# Patient Record
Sex: Male | Born: 1955 | Race: White | Hispanic: No | Smoking: Former smoker
Health system: Southern US, Community
[De-identification: ages and names within clinical notes are randomized; demographics above are authoritative.]

## PROBLEM LIST (undated history)

## (undated) DIAGNOSIS — I499 Cardiac arrhythmia, unspecified: Secondary | ICD-10-CM

---

## 2001-07-16 ENCOUNTER — Ambulatory Visit (HOSPITAL_BASED_OUTPATIENT_CLINIC_OR_DEPARTMENT_OTHER): Admission: RE | Admit: 2001-07-16 | Discharge: 2001-07-16 | Payer: Self-pay | Admitting: Ophthalmology

## 2003-10-01 ENCOUNTER — Ambulatory Visit (HOSPITAL_BASED_OUTPATIENT_CLINIC_OR_DEPARTMENT_OTHER): Admission: RE | Admit: 2003-10-01 | Discharge: 2003-10-01 | Payer: Self-pay | Admitting: Ophthalmology

## 2005-06-24 ENCOUNTER — Emergency Department: Payer: Self-pay | Admitting: Internal Medicine

## 2005-10-19 ENCOUNTER — Ambulatory Visit (HOSPITAL_BASED_OUTPATIENT_CLINIC_OR_DEPARTMENT_OTHER): Admission: RE | Admit: 2005-10-19 | Discharge: 2005-10-19 | Payer: Self-pay | Admitting: Ophthalmology

## 2006-02-02 ENCOUNTER — Ambulatory Visit (HOSPITAL_BASED_OUTPATIENT_CLINIC_OR_DEPARTMENT_OTHER): Admission: RE | Admit: 2006-02-02 | Discharge: 2006-02-02 | Payer: Self-pay | Admitting: Ophthalmology

## 2006-12-17 ENCOUNTER — Ambulatory Visit: Payer: Self-pay | Admitting: Orthopedic Surgery

## 2008-03-25 IMAGING — CR ORBITS FOR FOREIGN BODY - 2 VIEW
1 series · 3 of 3 positions shown · non-contrast
Comparison: none

REASON FOR EXAM: metal clearance for mri
COMMENTS:

PROCEDURE:     DXR - DXR ORBITS FOR MRI CLEARANCE  - December 17, 2006  [DATE]
RESULT:     Views of the orbits reveal no evidence of retained metallic
foreign bodies. I see no contraindication to MRI.

[Series 1: view not recorded · 0.17mm/px · 3 of 3 slices shown]
[im 1/3]
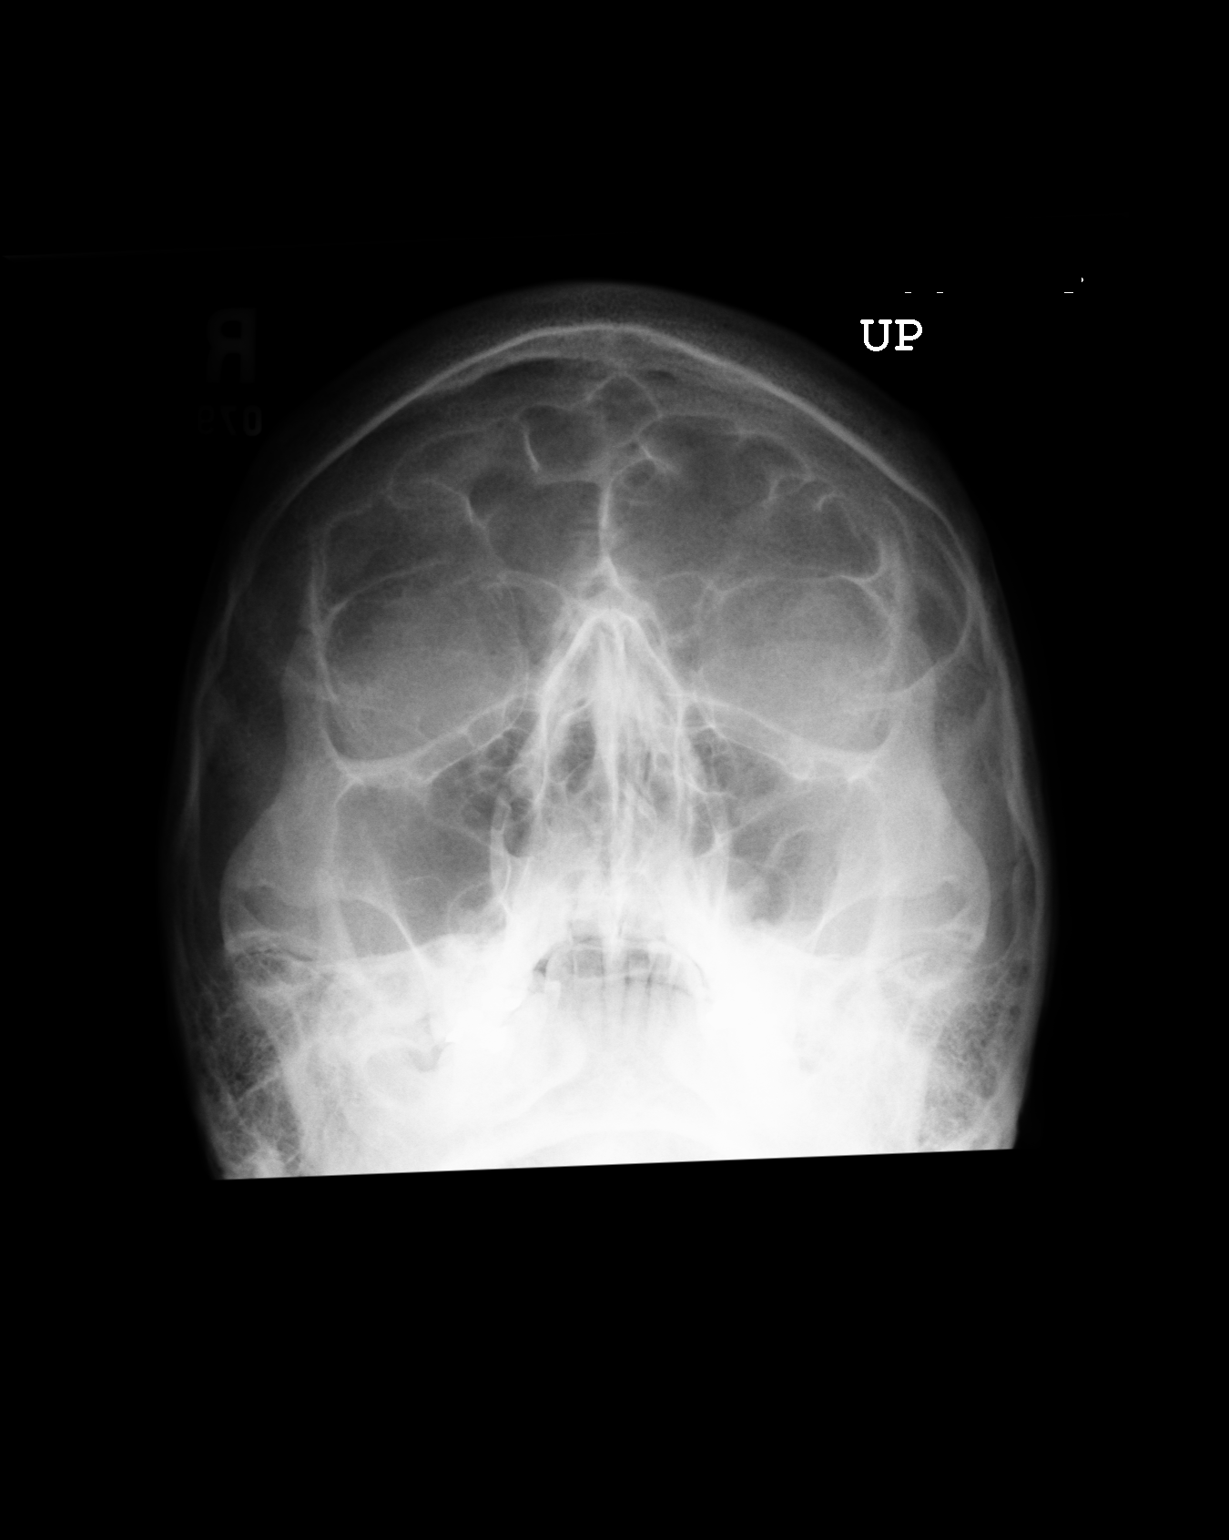
[im 2/3]
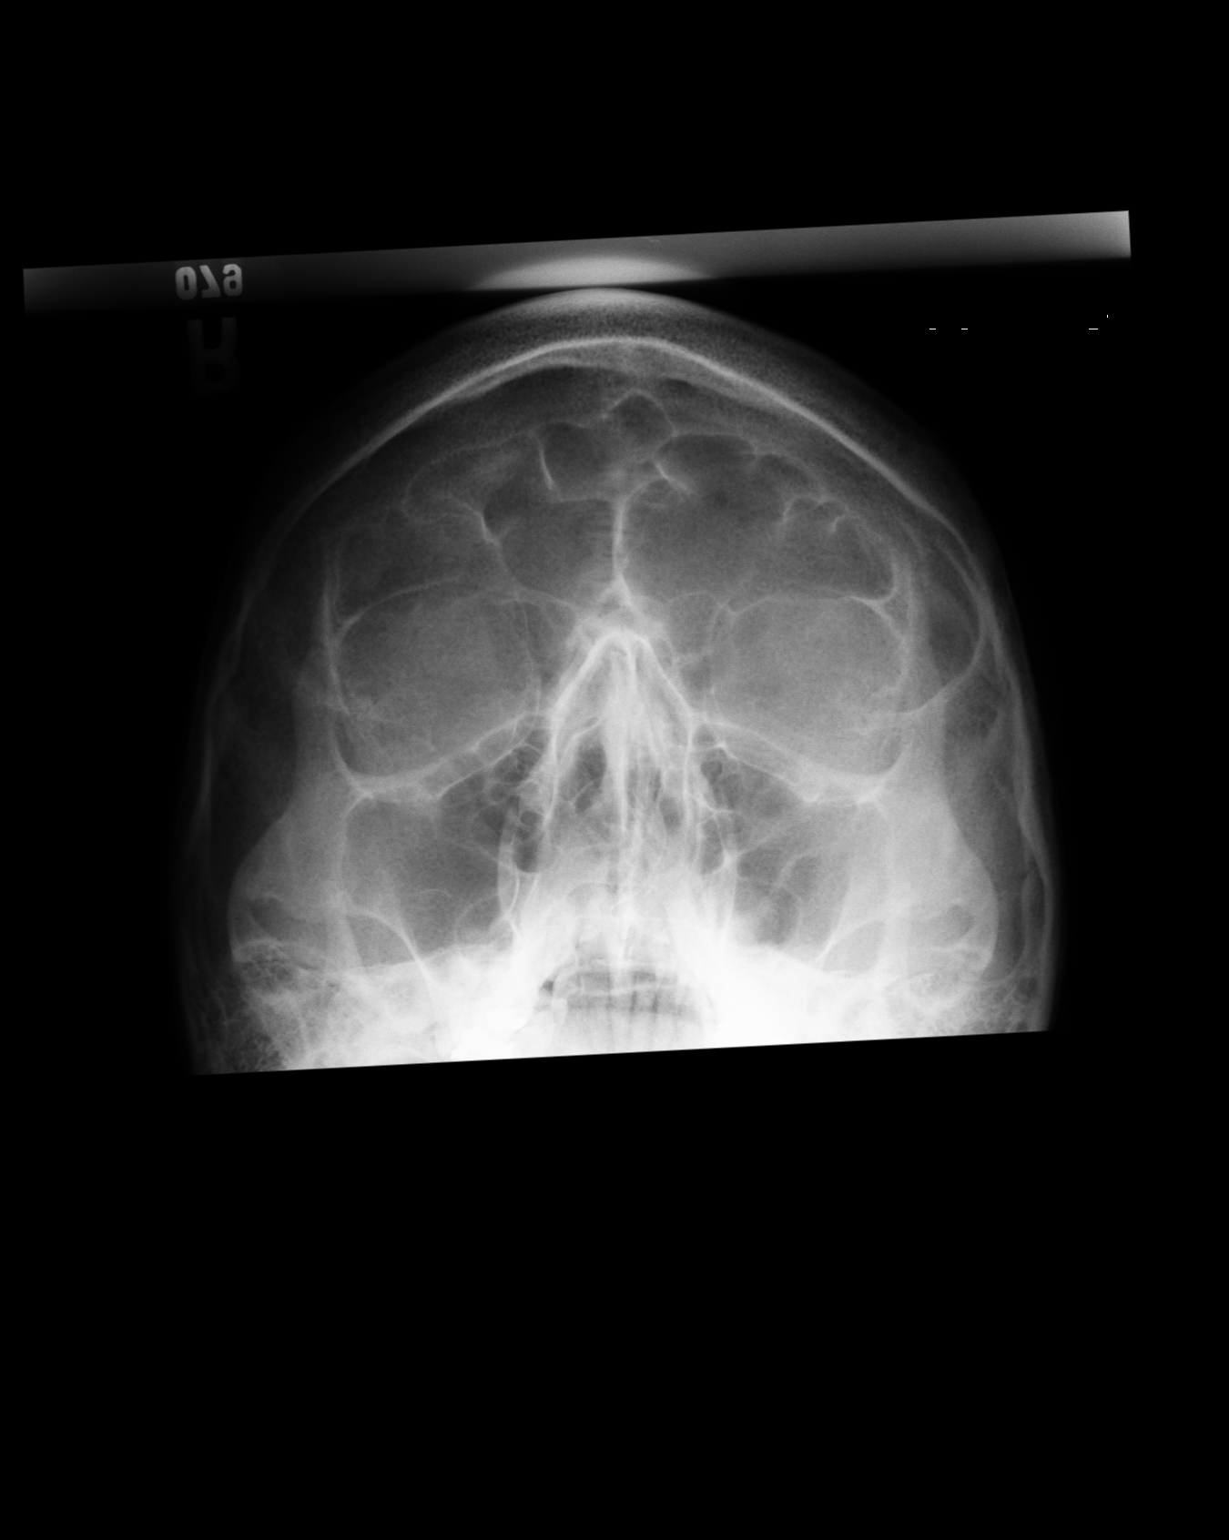
[im 3/3]
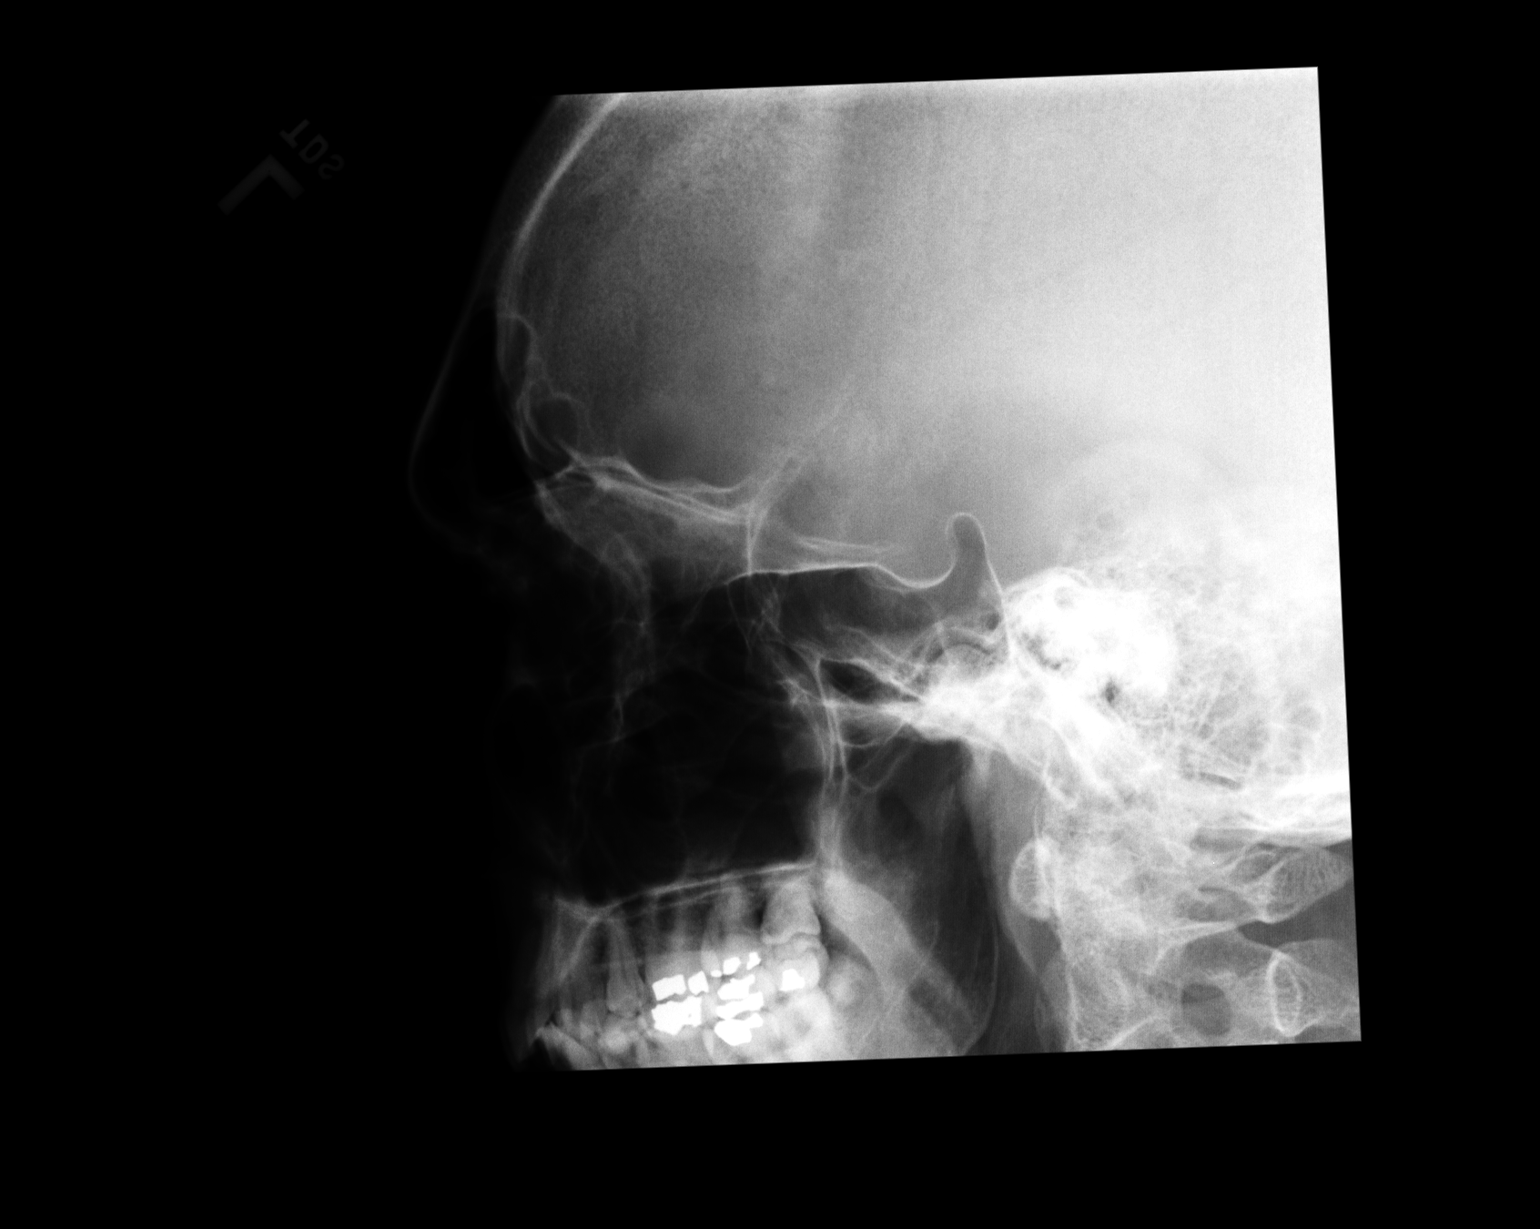

[3 of 3 positions shown; findings below may reference images not displayed]

IMPRESSION: Negative orbital series.

## 2012-05-08 ENCOUNTER — Ambulatory Visit: Payer: Self-pay | Admitting: Emergency Medicine

## 2012-05-08 LAB — HEPATIC FUNCTION PANEL A (ARMC)
Bilirubin, Direct: <0.05 mg/dL (ref 0.00–0.20)
Bilirubin,Total: 0.4 mg/dL (ref 0.2–1.0)
SGPT (ALT): 49 U/L (ref 12–78)
Total Protein: 7.7 g/dL (ref 6.4–8.2)

## 2012-05-08 LAB — CBC WITH DIFFERENTIAL/PLATELET
Eosinophil #: 0.1 10*3/uL (ref 0.0–0.7)
Eosinophil %: 0.8 %
HCT: 43.9 % (ref 40.0–52.0)
Lymphocyte %: 41.7 %
MCH: 34.6 pg — ABNORMAL HIGH (ref 26.0–34.0)
Monocyte #: 0.5 x10 3/mm (ref 0.2–1.0)
Monocyte %: 8 %
Neutrophil #: 3.2 10*3/uL (ref 1.4–6.5)
Neutrophil %: 48.6 %
Platelet: 176 10*3/uL (ref 150–440)
RBC: 4.37 10*6/uL — ABNORMAL LOW (ref 4.40–5.90)
WBC: 6.6 10*3/uL (ref 3.8–10.6)

## 2012-05-08 LAB — BASIC METABOLIC PANEL
Anion Gap: 12 (ref 7–16)
BUN: 14 mg/dL (ref 7–18)
Calcium, Total: 9.5 mg/dL (ref 8.5–10.1)
Chloride: 104 mmol/L (ref 98–107)
Co2: 24 mmol/L (ref 21–32)
Glucose: 84 mg/dL (ref 65–99)
Osmolality: 279 (ref 275–301)
Potassium: 4.7 mmol/L (ref 3.5–5.1)
Sodium: 140 mmol/L (ref 136–145)

## 2021-09-01 DIAGNOSIS — R69 Illness, unspecified: Secondary | ICD-10-CM | POA: Diagnosis not present

## 2021-09-01 DIAGNOSIS — I10 Essential (primary) hypertension: Secondary | ICD-10-CM | POA: Diagnosis not present

## 2021-09-01 DIAGNOSIS — Z125 Encounter for screening for malignant neoplasm of prostate: Secondary | ICD-10-CM | POA: Diagnosis not present

## 2021-12-15 ENCOUNTER — Other Ambulatory Visit
Admission: RE | Admit: 2021-12-15 | Discharge: 2021-12-15 | Disposition: A | Discharge status: Home / Self Care | Payer: Medicare Other | Source: Ambulatory Visit | Attending: Infectious Diseases | Admitting: Infectious Diseases

## 2021-12-15 ENCOUNTER — Encounter: Payer: Self-pay | Admitting: Infectious Diseases

## 2021-12-15 ENCOUNTER — Ambulatory Visit: Payer: Medicare Other | Attending: Infectious Diseases | Admitting: Infectious Diseases

## 2021-12-15 VITALS — BP 141/80 | HR 73 | Temp 97.9°F | Ht 70.0 in | Wt 186.0 lb

## 2021-12-15 DIAGNOSIS — R7303 Prediabetes: Secondary | ICD-10-CM | POA: Diagnosis not present

## 2021-12-15 DIAGNOSIS — I10 Essential (primary) hypertension: Secondary | ICD-10-CM | POA: Diagnosis not present

## 2021-12-15 DIAGNOSIS — B182 Chronic viral hepatitis C: Secondary | ICD-10-CM | POA: Insufficient documentation

## 2021-12-15 DIAGNOSIS — Z87891 Personal history of nicotine dependence: Secondary | ICD-10-CM | POA: Diagnosis not present

## 2021-12-15 LAB — HEPATITIS A ANTIBODY, TOTAL: hep A Total Ab: NONREACTIVE

## 2021-12-15 LAB — TSH: TSH: 1.476 u[IU]/mL (ref 0.350–4.500)

## 2021-12-15 LAB — PROTIME-INR
INR: 1 (ref 0.8–1.2)
Prothrombin Time: 12.6 seconds (ref 11.4–15.2)

## 2021-12-15 LAB — HIV ANTIBODY (ROUTINE TESTING W REFLEX): HIV Screen 4th Generation wRfx: NONREACTIVE

## 2021-12-15 LAB — HEPATITIS B SURFACE ANTIGEN: Hepatitis B Surface Ag: NONREACTIVE

## 2021-12-15 LAB — APTT: aPTT: 29 seconds (ref 24–36)

## 2021-12-15 NOTE — Patient Instructions (Signed)
You are here for hepc- today will do some labs in preparation for the treatment - you will most likely be taking [name] for 12 weeks- will check with insurance ?

## 2021-12-15 NOTE — Progress Notes (Signed)
NAME: Wesley Moran  ?DOB: 09-12-1955  ?MRN: [id number]  ?Date/Time: 12/15/2021 9:51 AM ? ? ?Subjective:  ? ?? ?Wesley Moran is a 66 y.o. male with a history of HTN,  referred to me for HEPC ?He was first diagnosed 25 yrs ago but never took any treatment ?HE has no had any blood transfusion, tattoos, IVDA- had snorted cocaine in the past ?He has no yellowing of eyes or urine, no loss of appetite, no BRBPR, no nausea, vomiting diarrhea ?He stopped drinking a year ago. Used ot have one beer a day before ?Quit smoking 35 yrs ago ?[place] a lot ?Eats very healthy mostly plant based diet ? ?PMH ?HTN ? ?PSH- none ? ? ?SH-   ?Ex smoker'-quit 35 yrs ago ?  ?FH- ?Patient says father died in plane crash ?Mom died of old age ? ?No Known Allergies ? ?Meds ?Metoprolol ?aspirin? ?  ? ?Abtx:  ?Anti-infectives (From admission, onward)  ? ? None  ? ?  ? ? ?REVIEW OF SYSTEMS:  ?Const: negative fever, negative chills, negative weight loss ?Eyes: negative diplopia or visual changes, negative eye pain ?ENT: negative coryza, negative sore throat ?Resp: negative cough, hemoptysis, dyspnea ?Cards: negative for chest pain, palpitations, lower extremity edema ?GU: negative for frequency, dysuria and hematuria ?GI: Negative for abdominal pain, diarrhea, bleeding, constipation ?Skin: negative for rash and pruritus ?Heme: negative for easy bruising and gum/nose bleeding ?MS: Left shoulder pain much improved ?Neurolo:negative for headaches, dizziness, vertigo, memory problems  ?Psych: negative for feelings of anxiety, depression  ?Endocrine: negative for thyroid, diabetes ?Allergy/Immunology- negative for any medication or food allergies ?? ?Objective:  ?VITALS:  ?BP (!) 141/80   Pulse 73   Temp 97.9 ?F (36.6 ?C) (Temporal)   Ht '5\' 10"'$  (1.778 m)   Wt 186 lb (84.4 kg)   BMI 26.69 kg/m?  ?PHYSICAL EXAM:  ?General: Alert, cooperative, no distress, appears stated age.  ?Head: Normocephalic, without obvious abnormality,  atraumatic. ?Eyes: Conjunctivae clear, anicteric sclerae. Pupils are equal ?ENT Nares normal. No drainage or sinus tenderness. ?Lips, mucosa, and tongue normal. No Thrush ?Neck: Supple, symmetrical, no adenopathy, thyroid: non tender ?no carotid bruit and no JVD. ?Back: No CVA tenderness. ?Lungs: Clear to auscultation bilaterally. No Wheezing or Rhonchi. No rales. ?Heart: Regular rate and rhythm, no murmur, rub or gallop. ?Abdomen: Soft, non-tender,not distended. Bowel sounds normal. No masses ?Extremities: atraumatic, no cyanosis. No edema. No clubbing ?Skin: No rashes or lesions. Or bruising ?Lymph: Cervical, supraclavicular normal. ?Neurologic: Grossly non-focal ?Pertinent Labs ?Lab Results ?CBC ?   ?Component Value Date/Time  ? WBC 6.6 05/08/2012 0929  ? RBC 4.37 (L) 05/08/2012 0929  ? HGB 15.1 05/08/2012 0929  ? HCT 43.9 05/08/2012 0929  ? PLT 176 05/08/2012 0929  ? MCV 100 05/08/2012 0929  ? MCH 34.6 (H) 05/08/2012 4270  ? MCHC 34.5 05/08/2012 0929  ? RDW 13.0 05/08/2012 0929  ? LYMPHSABS 2.8 05/08/2012 0929  ? MONOABS 0.5 05/08/2012 0929  ? EOSABS 0.1 05/08/2012 0929  ? BASOSABS 0.1 05/08/2012 0929  ? ? ? ?  Latest Ref Rng & Units 05/08/2012  ?  9:29 AM  ?CMP  ?Glucose 65 - 99 mg/dL 84    ?BUN 7 - 18 mg/dL 14    ?Creatinine 0.60 - 1.30 mg/dL 0.91    ?Sodium 136 - 145 mmol/L 140    ?Potassium 3.5 - 5.1 mmol/L 4.7    ?Chloride 98 - 107 mmol/L 104    ?CO2 21 - 32 mmol/L 24    ?  Calcium 8.5 - 10.1 mg/dL 9.5    ?Total Protein 6.4 - 8.2 g/dL 7.7    ?Total Bilirubin 0.2 - 1.0 mg/dL 0.4    ?Alkaline Phos 50 - 136 Unit/L 72    ?AST 15 - 37 Unit/L 30    ?ALT 12 - 78 U/L 49    ? ?Tests result DATE comment  ?HEPC RNA 826000 12/02/21   ?HEPC  Genotype 1a 12/02/21   ?Hepatitis B profile     ?Hepatitis A status     ?PT/PTT     ?AST, ALT, Bilirubin 43/50/0.5 12/02/21   ?Albumin 4.4    ?creatinine 0.88    ?HB/Platelet 16.8/181 12/02/21   ?HIV     ?AFP     ?TSH     ?comorbidities  HTN    ?tobacco ex Quit 35 yrs ago   ?alcohol ex  Quit 1 year ago   ?drug none    ?APRI Score     ?FIB 4 score     ?Current meds Metoprolol, aspirin    ?Ultrasound Elastography     ?     ?     ?  ? ? ?? ?Impression/Recommendation ?Otitis C without any decompensation. ?Viral load around 800,000. ?Patient is willing to get treatment ?We will send some more labs including genotype, AFP, TSH, PT PTT, FibroSure and ultrasound elastography.  Once I have the results we will apply for his insurance to get him Raeanne Gathers which is sofosbuvir plus velpatasvir ? ?Hypertension on metoprolol ? ?Hemoglobin A1c 5.7, prediabetes.  Asked him not to drink concentrated fruit juices like orange and pineapple. ?He will follow-up in 1 month's time when we have all the labs and ultrasound. ?? ??_________________________________________________ ?Discussed with patient in great detail.  ?Note:  This document was prepared using Dragon voice recognition software and may include unintentional dictation errors.  ?

## 2021-12-16 LAB — HEPATITIS B CORE ANTIBODY, TOTAL: Hep B Core Total Ab: NONREACTIVE

## 2021-12-16 LAB — AFP TUMOR MARKER: AFP, Serum, Tumor Marker: 9.6 ng/mL — ABNORMAL HIGH (ref 0.0–8.4)

## 2021-12-17 LAB — HCV FIBROSURE
ALPHA 2-MACROGLOBULINS, QN: 421 mg/dL — ABNORMAL HIGH (ref 110–276)
ALT (SGPT) P5P: 71 IU/L — ABNORMAL HIGH (ref 0–55)
Apolipoprotein A-1: 149 mg/dL (ref 101–178)
Bilirubin, Total: 0.5 mg/dL (ref 0.0–1.2)
Fibrosis Score: 0.76 — ABNORMAL HIGH (ref 0.00–0.21)
GGT: 61 IU/L (ref 0–65)
Haptoglobin: 83 mg/dL (ref 32–363)
Necroinflammat Activity Score: 0.6 — ABNORMAL HIGH (ref 0.00–0.17)

## 2021-12-20 ENCOUNTER — Ambulatory Visit
Admission: RE | Admit: 2021-12-20 | Discharge: 2021-12-20 | Disposition: A | Discharge status: Home / Self Care | Payer: Medicare Other | Source: Ambulatory Visit | Attending: Infectious Diseases | Admitting: Infectious Diseases

## 2021-12-20 DIAGNOSIS — B182 Chronic viral hepatitis C: Secondary | ICD-10-CM | POA: Diagnosis present

## 2022-01-03 NOTE — Progress Notes (Signed)
Thanks will see, Herb Grays can you schedule in office please

## 2022-01-12 ENCOUNTER — Encounter: Payer: Self-pay | Admitting: Gastroenterology

## 2022-01-12 DIAGNOSIS — Z1211 Encounter for screening for malignant neoplasm of colon: Secondary | ICD-10-CM

## 2022-01-12 MED ORDER — NA SULFATE-K SULFATE-MG SULF 17.5-3.13-1.6 GM/177ML PO SOLN: 1.0000 | Freq: Once | ORAL | 0 refills | Status: AC

## 2022-01-12 NOTE — Progress Notes (Signed)
error 

## 2022-01-12 NOTE — Addendum Note (Signed)
Addended by: Eliseo Squires on: 01/12/2022 03:34 PM   Modules accepted: Orders

## 2022-01-17 ENCOUNTER — Ambulatory Visit: Payer: Medicare Other | Admitting: Infectious Diseases

## 2022-01-31 ENCOUNTER — Ambulatory Visit: Payer: Medicare Other | Admitting: Infectious Diseases

## 2022-02-02 ENCOUNTER — Ambulatory Visit: Payer: Medicare Other | Attending: Infectious Diseases | Admitting: Infectious Diseases

## 2022-02-02 ENCOUNTER — Other Ambulatory Visit
Admission: RE | Admit: 2022-02-02 | Discharge: 2022-02-02 | Disposition: A | Discharge status: Home / Self Care | Payer: Medicare Other | Attending: Infectious Diseases | Admitting: Infectious Diseases

## 2022-02-02 ENCOUNTER — Encounter: Payer: Self-pay | Admitting: Infectious Diseases

## 2022-02-02 ENCOUNTER — Telehealth: Payer: Self-pay

## 2022-02-02 ENCOUNTER — Other Ambulatory Visit (HOSPITAL_COMMUNITY): Payer: Self-pay

## 2022-02-02 VITALS — BP 134/76 | HR 61 | Temp 97.5°F | Ht 70.0 in | Wt 183.0 lb

## 2022-02-02 DIAGNOSIS — B182 Chronic viral hepatitis C: Secondary | ICD-10-CM | POA: Insufficient documentation

## 2022-02-02 DIAGNOSIS — R7303 Prediabetes: Secondary | ICD-10-CM | POA: Diagnosis not present

## 2022-02-02 DIAGNOSIS — Z87891 Personal history of nicotine dependence: Secondary | ICD-10-CM | POA: Diagnosis not present

## 2022-02-02 DIAGNOSIS — I10 Essential (primary) hypertension: Secondary | ICD-10-CM | POA: Diagnosis present

## 2022-02-02 DIAGNOSIS — B1920 Unspecified viral hepatitis C without hepatic coma: Secondary | ICD-10-CM | POA: Insufficient documentation

## 2022-02-02 LAB — CBC WITH DIFFERENTIAL/PLATELET
Abs Immature Granulocytes: 0.01 10*3/uL (ref 0.00–0.07)
Basophils Absolute: 0.1 10*3/uL (ref 0.0–0.1)
Basophils Relative: 2 %
Eosinophils Absolute: 0.1 10*3/uL (ref 0.0–0.5)
Eosinophils Relative: 1 %
HCT: 45.7 % (ref 39.0–52.0)
Hemoglobin: 15.5 g/dL (ref 13.0–17.0)
Immature Granulocytes: 0 %
Lymphocytes Relative: 35 %
Lymphs Abs: 2.1 10*3/uL (ref 0.7–4.0)
MCH: 33.7 pg (ref 26.0–34.0)
MCHC: 33.9 g/dL (ref 30.0–36.0)
MCV: 99.3 fL (ref 80.0–100.0)
Monocytes Absolute: 0.5 10*3/uL (ref 0.1–1.0)
Monocytes Relative: 8 %
Neutro Abs: 3.2 10*3/uL (ref 1.7–7.7)
Neutrophils Relative %: 54 %
Platelets: 165 10*3/uL (ref 150–400)
RBC: 4.6 MIL/uL (ref 4.22–5.81)
RDW: 12.5 % (ref 11.5–15.5)
WBC: 6 10*3/uL (ref 4.0–10.5)
nRBC: 0 % (ref 0.0–0.2)

## 2022-02-02 NOTE — Patient Instructions (Addendum)
Today you are here to review labs and started treatment for HEPC- we will get Prior authorization for epclusa from insurance. Will do a few labs today. Once medicine approved will give you a call

## 2022-02-02 NOTE — Telephone Encounter (Signed)
[facility] Patient Advocate Encounter   Received notification from [place] [facility] that prior authorization for [facility] is required.   PA submitted on 02/02/22 Key [id number] Status is pending    [facility] will continue to follow.   Ileene Patrick, [facility] Patient [facility] for Infectious Disease Phone: [phone] Fax:  [phone]

## 2022-02-02 NOTE — Telephone Encounter (Signed)
[facility] Patient Advocate Encounter  I was successful in securing patient a $30,000 grant from [employer] to provide copayment coverage for [name].  This will make the out of pocket expense $0.00.     [employer] ID: [id number]  I have spoken with the patient..     The billing information is as follows and has been shared with [facility].    RxBin: [id number] PCN: [id number] Member ID: [id number] Group ID: [id number] Dates of Eligibility: 01/03/22 through 01/03/23  Patient knows to call the office with questions or concerns.  Ileene Patrick, [facility] Patient [facility] for Infectious Disease Phone: [phone] Fax:  [phone]

## 2022-02-03 ENCOUNTER — Telehealth: Payer: Self-pay | Admitting: Pharmacist

## 2022-02-03 ENCOUNTER — Other Ambulatory Visit: Payer: Self-pay | Admitting: Pharmacist

## 2022-02-03 ENCOUNTER — Encounter: Payer: Self-pay | Admitting: Gastroenterology

## 2022-02-03 ENCOUNTER — Other Ambulatory Visit (HOSPITAL_COMMUNITY): Payer: Self-pay

## 2022-02-03 ENCOUNTER — Other Ambulatory Visit: Payer: Self-pay | Admitting: Gastroenterology

## 2022-02-03 DIAGNOSIS — B182 Chronic viral hepatitis C: Secondary | ICD-10-CM

## 2022-02-03 LAB — HEPATITIS B SURFACE ANTIBODY, QUANTITATIVE: Hep B S AB Quant (Post): <3.1 m[IU]/mL — ABNORMAL LOW (ref >9.9)

## 2022-02-03 MED ORDER — PEG 3350-KCL-NA BICARB-NACL 420 G PO SOLR: 4000.0000 mL | Freq: Once | ORAL | 0 refills | Status: AC

## 2022-02-03 MED ORDER — SOFOSBUVIR-VELPATASVIR 400-100 MG PO TABS
1.0000 | ORAL_TABLET | Freq: Every day | ORAL | 2 refills | Status: AC
  Filled 2022-02-03 (×2): qty 28, 28d supply, fill #0
  Filled 2022-02-23: qty 28, 28d supply, fill #1
  Filled 2022-03-24: qty 28, 28d supply, fill #2

## 2022-02-03 NOTE — Telephone Encounter (Signed)
Patient is approved to receive [name] x 12 weeks for chronic Hepatitis C infection. Counseled patient to take [name] daily with or without food. Encouraged patient not to miss any doses and explained how their chance of cure could go down with each dose missed. Counseled patient on what to do if dose is missed - if it is closer to the missed dose take immediately; if closer to next dose skip dose and take the next dose at the usual time. Counseled patient on common side effects such as headache, fatigue, and nausea and that these normally decrease with time. I reviewed patient medications and found potential drug interactions. Discussed with patient that there are several drug interactions including acid suppressants. He states he uses baking soda if he experiences acid reflux, so discussed separating his [name] from this by a couple hours and taking his [name] with food. Instructed patient to call clinic if he wishes to start a new medication during course of therapy. Also advised patient to call if he experiences any side effects. Patient will follow-up with Dr. Rivka Safer 2 months into treatment (04/04/22).  Wesley Moran, PharmD, CPP Clinical Pharmacist Practitioner Infectious Diseases Clinical Pharmacist [facility] for Infectious Disease

## 2022-02-06 ENCOUNTER — Ambulatory Visit: Payer: Medicare Other | Admitting: Registered Nurse

## 2022-02-06 ENCOUNTER — Encounter: Payer: Self-pay | Admitting: Gastroenterology

## 2022-02-06 ENCOUNTER — Encounter
Admission: RE | Disposition: A | Discharge status: Home / Self Care | Payer: Self-pay | Source: Home / Self Care | Attending: Gastroenterology

## 2022-02-06 ENCOUNTER — Ambulatory Visit
Admission: RE | Admit: 2022-02-06 | Discharge: 2022-02-06 | Disposition: A | Discharge status: Home / Self Care | Payer: Medicare Other | Attending: Gastroenterology | Admitting: Gastroenterology

## 2022-02-06 DIAGNOSIS — Z1211 Encounter for screening for malignant neoplasm of colon: Secondary | ICD-10-CM | POA: Insufficient documentation

## 2022-02-06 DIAGNOSIS — D122 Benign neoplasm of ascending colon: Secondary | ICD-10-CM | POA: Insufficient documentation

## 2022-02-06 DIAGNOSIS — D126 Benign neoplasm of colon, unspecified: Secondary | ICD-10-CM | POA: Diagnosis not present

## 2022-02-06 DIAGNOSIS — Z87891 Personal history of nicotine dependence: Secondary | ICD-10-CM | POA: Diagnosis not present

## 2022-02-06 HISTORY — PX: COLONOSCOPY WITH PROPOFOL: SHX5780

## 2022-02-06 HISTORY — DX: Cardiac arrhythmia, unspecified: I49.9

## 2022-02-06 SURGERY — COLONOSCOPY WITH PROPOFOL
Anesthesia: General

## 2022-02-06 MED ORDER — PHENYLEPHRINE 80 MCG/ML (10ML) SYRINGE FOR IV PUSH (FOR BLOOD PRESSURE SUPPORT)
PREFILLED_SYRINGE | INTRAVENOUS | Status: AC
  Filled 2022-02-06: qty 10

## 2022-02-06 MED ORDER — PROPOFOL 10 MG/ML IV BOLUS
INTRAVENOUS | Status: AC
  Filled 2022-02-06: qty 20

## 2022-02-06 MED ORDER — LIDOCAINE HCL (CARDIAC) PF 100 MG/5ML IV SOSY
PREFILLED_SYRINGE | INTRAVENOUS | Status: DC | PRN
  Administered 2022-02-06: 100 mg via INTRAVENOUS

## 2022-02-06 MED ORDER — PROPOFOL 500 MG/50ML IV EMUL
INTRAVENOUS | Status: DC | PRN
  Administered 2022-02-06: 175 ug/kg/min via INTRAVENOUS

## 2022-02-06 MED ORDER — PROPOFOL 10 MG/ML IV BOLUS
INTRAVENOUS | Status: DC | PRN
  Administered 2022-02-06: 30 mg via INTRAVENOUS
  Administered 2022-02-06: 90 mg via INTRAVENOUS

## 2022-02-06 MED ORDER — STERILE WATER FOR IRRIGATION IR SOLN
Status: DC | PRN
  Administered 2022-02-06 (×2): 60 mL
  Administered 2022-02-06: 120 mL

## 2022-02-06 MED ORDER — SODIUM CHLORIDE 0.9 % IV SOLN
INTRAVENOUS | Status: DC
  Administered 2022-02-06: 1000 mL via INTRAVENOUS

## 2022-02-06 MED ORDER — PROPOFOL 1000 MG/100ML IV EMUL
INTRAVENOUS | Status: AC
  Filled 2022-02-06: qty 100

## 2022-02-06 NOTE — Anesthesia Preprocedure Evaluation (Signed)
Anesthesia Evaluation  Patient identified by MRN, date of birth, ID band Patient awake    Reviewed: Allergy & Precautions, NPO status , Patient's Chart, lab work & pertinent test results  History of Anesthesia Complications Negative for: history of anesthetic complications  Airway Mallampati: III  TM Distance: <3 FB Neck ROM: full    Dental  (+) Chipped   Pulmonary neg shortness of breath, former smoker,    Pulmonary exam normal        Cardiovascular Exercise Tolerance: Good (-) anginaNormal cardiovascular exam+ dysrhythmias      Neuro/Psych negative neurological ROS  negative psych ROS   GI/Hepatic negative GI ROS, Neg liver ROS, neg GERD  ,  Endo/Other  negative endocrine ROS  Renal/GU negative Renal ROS  negative genitourinary   Musculoskeletal   Abdominal   Peds  Hematology negative hematology ROS (+)   Anesthesia Other Findings Past Medical History: No date: Dysrhythmia  History reviewed. No pertinent surgical history.  BMI    Body Mass Index: 26.31 kg/m      Reproductive/Obstetrics negative OB ROS                             Anesthesia Physical Anesthesia Plan  ASA: 3  Anesthesia Plan: General   Post-op Pain Management:    Induction: Intravenous  PONV Risk Score and Plan: Propofol infusion and TIVA  Airway Management Planned: Natural Airway and Nasal Cannula  Additional Equipment:   Intra-op Plan:   Post-operative Plan:   Informed Consent: I have reviewed the patients History and Physical, chart, labs and discussed the procedure including the risks, benefits and alternatives for the proposed anesthesia with the patient or authorized representative who has indicated his/her understanding and acceptance.     Dental Advisory Given  Plan Discussed with: Anesthesiologist, CRNA and Surgeon  Anesthesia Plan Comments: (Patient consented for risks of anesthesia  including but not limited to:  - adverse reactions to medications - risk of airway placement if required - damage to eyes, teeth, lips or other oral mucosa - nerve damage due to positioning  - sore throat or hoarseness - Damage to heart, brain, nerves, lungs, other parts of body or loss of life  Patient voiced understanding.)        Anesthesia Quick Evaluation

## 2022-02-07 ENCOUNTER — Encounter: Payer: Self-pay | Admitting: Gastroenterology

## 2022-02-07 LAB — SURGICAL PATHOLOGY

## 2022-02-08 ENCOUNTER — Encounter: Payer: Self-pay | Admitting: Gastroenterology

## 2022-02-16 ENCOUNTER — Ambulatory Visit: Payer: Medicare Other | Admitting: Infectious Diseases

## 2022-02-23 ENCOUNTER — Other Ambulatory Visit (HOSPITAL_COMMUNITY): Payer: Self-pay

## 2022-03-01 ENCOUNTER — Other Ambulatory Visit (HOSPITAL_COMMUNITY): Payer: Self-pay

## 2022-03-06 ENCOUNTER — Telehealth: Payer: Self-pay

## 2022-03-06 ENCOUNTER — Other Ambulatory Visit (HOSPITAL_COMMUNITY): Payer: Self-pay

## 2022-03-06 NOTE — Telephone Encounter (Signed)
I am reaching out to [facility] to see if we can mail out another bottle . I will call the patient back once I get an answer

## 2022-03-06 NOTE — Telephone Encounter (Signed)
Patient called requesting refill of [name]. [name] to call [facility] to inquire about refill and call patient back.   Beryle Flock, RN

## 2022-03-06 NOTE — Telephone Encounter (Signed)
Patient called and says he checked his home, but has not received his medication yet and would like to know what to do next.   Beryle Flock, RN

## 2022-03-24 ENCOUNTER — Other Ambulatory Visit (HOSPITAL_COMMUNITY): Payer: Self-pay

## 2022-03-29 ENCOUNTER — Other Ambulatory Visit (HOSPITAL_COMMUNITY): Payer: Self-pay

## 2022-04-04 ENCOUNTER — Other Ambulatory Visit
Admission: RE | Admit: 2022-04-04 | Discharge: 2022-04-04 | Disposition: A | Discharge status: Home / Self Care | Payer: Medicare Other | Attending: Infectious Diseases | Admitting: Infectious Diseases

## 2022-04-04 ENCOUNTER — Ambulatory Visit: Payer: Medicare Other | Attending: Infectious Diseases | Admitting: Infectious Diseases

## 2022-04-04 VITALS — BP 143/86 | HR 62 | Temp 98.2°F | Ht 69.0 in | Wt 186.0 lb

## 2022-04-04 DIAGNOSIS — I10 Essential (primary) hypertension: Secondary | ICD-10-CM | POA: Insufficient documentation

## 2022-04-04 DIAGNOSIS — Z87891 Personal history of nicotine dependence: Secondary | ICD-10-CM | POA: Insufficient documentation

## 2022-04-04 DIAGNOSIS — B182 Chronic viral hepatitis C: Secondary | ICD-10-CM | POA: Insufficient documentation

## 2022-04-04 LAB — COMPREHENSIVE METABOLIC PANEL
ALT: 25 U/L (ref 0–44)
AST: 27 U/L (ref 15–41)
Albumin: 4.2 g/dL (ref 3.5–5.0)
Alkaline Phosphatase: 66 U/L (ref 38–126)
Anion gap: 6 (ref 5–15)
BUN: 10 mg/dL (ref 8–23)
CO2: 26 mmol/L (ref 22–32)
Calcium: 9.5 mg/dL (ref 8.9–10.3)
Chloride: 108 mmol/L (ref 98–111)
Creatinine, Ser: 0.89 mg/dL (ref 0.61–1.24)
GFR, Estimated: >60 mL/min (ref >60)
Glucose, Bld: 105 mg/dL — ABNORMAL HIGH (ref 70–99)
Potassium: 4.5 mmol/L (ref 3.5–5.1)
Sodium: 140 mmol/L (ref 135–145)
Total Bilirubin: 0.7 mg/dL (ref 0.3–1.2)
Total Protein: 7.6 g/dL (ref 6.5–8.1)

## 2022-04-04 NOTE — Patient Instructions (Addendum)
You are here for follow up of hepc - you are on epclusa- you will finish in 4 weeks- today will do labs- if undetectable will see you back in 6 months

## 2022-04-04 NOTE — Progress Notes (Signed)
NAME: Wesley Moran  DOB: 1955/08/24  MRN: [id number]  Date/Time: 04/04/2022 8:34 AM   Subjective:  Follow up visit for HEPC On epclusa- third month- doing well Just missed 1 dose because of not getting medicine on time No side effects like nausea, vomiting, diarrhea   Following from last note ? KRISTAN VOTTA is a 66 y.o. male with a history of HTN,   He was first diagnosed 25 yrs ago but never took any treatment HE has no had any blood transfusion, tattoos, IVDA- had snorted cocaine in the past He has no yellowing of eyes or urine, no loss of appetite, no BRBPR, no nausea, vomiting diarrhea He stopped drinking a year ago. Used ot have one beer a day before Quit smoking 35 yrs ago [place] a lot Eats very healthy mostly plant based diet  PMH HTN  PSH- none   SH-   Ex smoker'-quit 35 yrs ago   FH- Patient says father died in plane crash Mom died of old age  No Known Allergies  Meds Metoprolol Aspirin MVT Takes baking soda when he has GERD  rarely?    Abtx:  Anti-infectives (From admission, onward)    None       REVIEW OF SYSTEMS:  Const: negative fever, negative chills, negative weight loss Eyes: negative diplopia or visual changes, negative eye pain ENT: negative coryza, negative sore throat Resp: negative cough, hemoptysis, dyspnea Cards: negative for chest pain, palpitations, lower extremity edema GU: negative for frequency, dysuria and hematuria GI: Negative for abdominal pain, diarrhea, bleeding, constipation Skin: negative for rash and pruritus Heme: negative for easy bruising and gum/nose bleeding MS: Left shoulder pain much improved Neurolo:negative for headaches, dizziness, vertigo, memory problems  Psych: negative for feelings of anxiety, depression  Endocrine: negative for thyroid, diabetes Allergy/Immunology- negative for any medication or food allergies ? Objective:  VITALS:  BP (!) 143/86   Pulse 62   Temp 98.2 F (36.8 C)  (Temporal)   Ht '5\' 9"'$  (1.753 m)   Wt 186 lb (84.4 kg)   BMI 27.47 kg/m  PHYSICAL EXAM:  General: Alert, cooperative, no distress, appears stated age.  Head: Normocephalic, without obvious abnormality, atraumatic. Eyes: Conjunctivae clear, anicteric sclerae. Pupils are equal ENT Nares normal. No drainage or sinus tenderness. Lips, mucosa, and tongue normal. No Thrush Neck: Supple, symmetrical, no adenopathy, thyroid: non tender no carotid bruit and no JVD. Back: No CVA tenderness. Lungs: Clear to auscultation bilaterally. No Wheezing or Rhonchi. No rales. Heart: Regular rate and rhythm, no murmur, rub or gallop. Abdomen: Soft, non-tender,not distended. Bowel sounds normal. No masses Extremities: atraumatic, no cyanosis. No edema. No clubbing Skin: No rashes or lesions. Or bruising Lymph: Cervical, supraclavicular normal. Neurologic: Grossly non-focal Pertinent Labs   ? Impression/Recommendation Hepatitis C without any decompensation.on epclusa . Before he started treatment Viral load around 800,000. Genotype 1 a  PTT, PT- N Platelet N LFTS - ALT 71 AFP 9.6 ( upper limit of N)   ultrasound elastography KPA 3- no cirrhosis Fibrosure F4  Will check labs today  Hypertension on metoprolol  Hemoglobin A1c 5.7, prediabetes.  Asked him not to drink concentrated fruit juices like orange and pineapple.  ? ?_________________________________________________ Discussed with patient in great detail. IF HCV RNA undetectable today- will follow him in 6 months Note:  This document was prepared using Dragon voice recognition software and may include unintentional dictation errors.

## 2022-04-06 LAB — HCV RNA QUANT: HCV Quantitative: NOT DETECTED IU/mL (ref >50)

## 2022-04-10 ENCOUNTER — Telehealth: Payer: Self-pay

## 2022-04-10 NOTE — Telephone Encounter (Signed)
Spoke with Wesley Moran, relayed that HCV viral load is undetectable and that this indicates his medication is working. Advised him to complete treatment as prescribed. Patient verbalized understanding and has no further questions.   Beryle Flock, RN

## 2022-04-10 NOTE — Telephone Encounter (Signed)
-----   Message from Tsosie Billing, MD sent at 04/10/2022 11:16 AM EDT ----- Please let him know that HCV RNA was undetectable and the medicine is working and he should complete it. Thx  ----- Message ----- From: Buel Ream, [facility] Sent: 04/04/2022   9:34 AM EDT To: Tsosie Billing, MD

## 2022-04-19 ENCOUNTER — Other Ambulatory Visit (HOSPITAL_COMMUNITY): Payer: Self-pay

## 2022-10-05 ENCOUNTER — Ambulatory Visit: Payer: Medicare Other | Attending: Infectious Diseases | Admitting: Infectious Diseases

## 2022-10-05 DIAGNOSIS — B182 Chronic viral hepatitis C: Secondary | ICD-10-CM | POA: Diagnosis not present

## 2022-10-05 DIAGNOSIS — B1920 Unspecified viral hepatitis C without hepatic coma: Secondary | ICD-10-CM | POA: Diagnosis present

## 2022-10-05 DIAGNOSIS — Z87891 Personal history of nicotine dependence: Secondary | ICD-10-CM | POA: Insufficient documentation

## 2022-10-05 DIAGNOSIS — I10 Essential (primary) hypertension: Secondary | ICD-10-CM | POA: Diagnosis not present

## 2022-10-05 NOTE — Progress Notes (Signed)
The purpose of this virtual visit is to provide medical care while limiting exposure to the novel coronavirus (COVID19) for both patient and office staff.   Consent was obtained for phone visit:  Yes.   Answered questions that patient had about telehealth interaction:  Yes.   I discussed the limitations, risks, security and privacy concerns of performing an evaluation and management service by telephone. I also discussed with the patient that there may be a patient responsible charge related to this service. The patient expressed understanding and agreed to proceed.   Patient Location: Home Provider Location:The purpose of this virtual visit is to provide medical care while limiting exposure to the novel coronavirus (COVID19) for both patient and office staff.   Consent was obtained for My chart video visit:  Yes.   Answered questions that patient had about telehealth interaction:  Yes.   I discussed the limitations, risks, security and privacy concerns of performing an evaluation and management service by telephone. I also discussed with the patient that there may be a patient responsible charge related to this service. The patient expressed understanding and agreed to proceed.   Patient Location: Home Provider Location: office   NAME: Wesley Moran  DOB: 21-Mar-1956  MRN: [id number]  Date/Time: 10/05/2022 8:45 AM   Subjective:  67 y.o. male with a history of HTN,  Follow up visit for HEPC Pt has completed treatment for HEPC  few months ago- took 12 weeks of [name] Developed URI symptoms a few days ago and took COVID test and was positive and hence the video visit He is doing fine- no fever  PMH HTN  PSH- none   SH-   Ex smoker'-quit 35 yrs ago   FH- Patient says father died in plane crash Mom died of old age  No Known Allergies  Meds Metoprolol Aspirin MVT Takes baking soda when he has GERD  rarely?    Abtx:  Anti-infectives (From admission, onward)     None       REVIEW OF SYSTEMS:  Const: negative fever, negative chills, negative weight loss Eyes: negative diplopia or visual changes, negative eye pain ENT: +coryza, negative sore throat Resp: negative cough, hemoptysis, dyspnea Cards: negative for chest pain, palpitations, lower extremity edema GU: negative for frequency, dysuria and hematuria GI: Negative for abdominal pain, diarrhea, bleeding, constipation, says bowels are more regular now Skin: negative for rash and pruritus Heme: negative for easy bruising and gum/nose bleeding MS: no pain Neurolo:negative for headaches, dizziness, vertigo, memory problems  Psych: negative for feelings of anxiety, depression  Endocrine: negative for thyroid, diabetes Allergy/Immunology- negative for any medication or food allergies ? Objective:  VITALS:  There were no vitals taken for this visit. PHYSICAL EXAM:  General: Alert, cooperative, no distress, appears stated age.  Head: Normocephalic, without obvious abnormality, atraumatic. Eyes: Conjunctivae clear, anicteric sclerae.  ENT Nares normal. No drainage or sinus tenderness. Neck: , symmetrical,   Lungs: cannot be examined Heart: not examined Abdomen: not examined Neurologic: Grossly non-focal    ? Impression/Recommendation Hepatitis C without any decompensation.completed epclusa -12 week treatment  Before he started treatment Viral load around 800,000.on 04/04/22 HIV RNA undetectable   COVID- with minimal symptoms  Hypertension on metoprolol  Need to do HEPC rna to see Sustained virological response Pt is traveling for the next 2 weeks-  So he will do labs once he gets back- he will let us know   ? ?Total time spent on this video  visit 9 min _____________________________________

## 2023-03-29 IMAGING — US US LIVER ELASTOGRAPHY
1 series · 12 of 25 positions shown · non-contrast
Comparison: None Available.

CLINICAL DATA: Chronic hepatitis C

EXAM:
US LIVER ELASTOGRAPHY
TECHNIQUE: Sonography of the liver was performed. In addition, ultrasound
elastography evaluation of the liver was performed. A region of
interest was placed within the right lobe of the liver. Following
application of a compressive sonographic pulse, tissue
compressibility was assessed. Multiple assessments were performed at
the selected site. Median tissue compressibility was determined.
Previously, hepatic stiffness was assessed by shear wave velocity.
Based on recently published Society of Radiologists in Ultrasound
consensus article, reporting is now recommended to be performed in
the SI units of pressure (kiloPascals) representing hepatic
stiffness/elasticity. The obtained result is compared to the
published reference standards. (cACLD = compensated Advanced Chronic
Liver Disease)

[Series 1: us elastography liver · 12 of 30 slices shown]
[im 2/30]
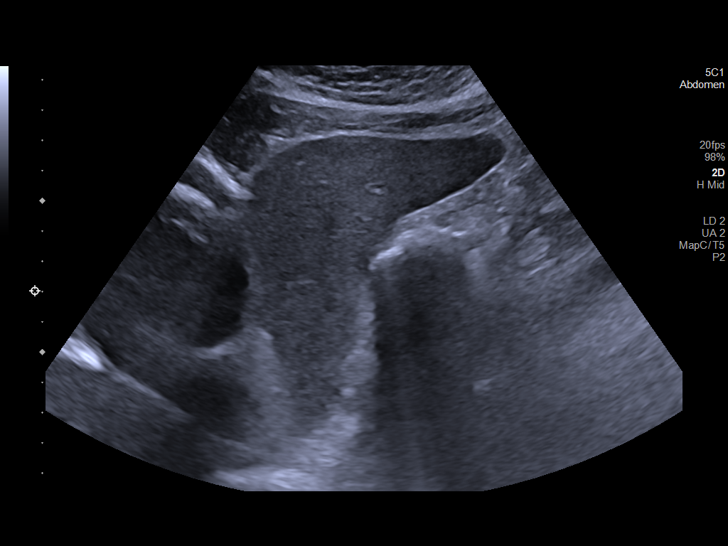
[im 4/30]
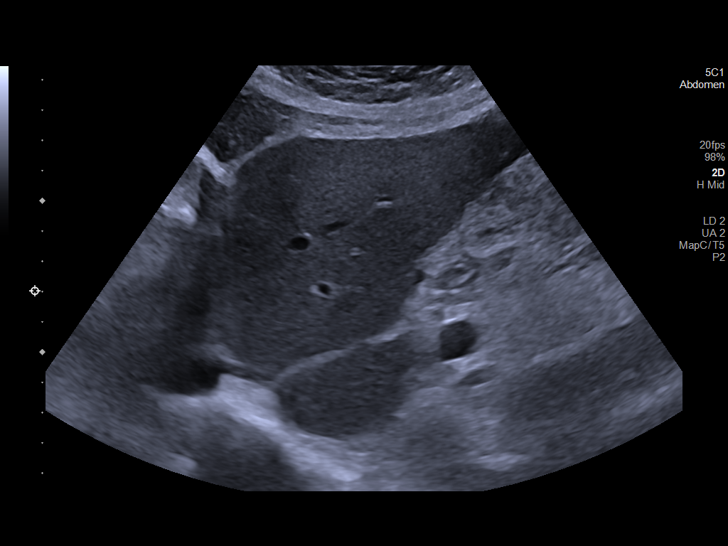
[im 7/30]
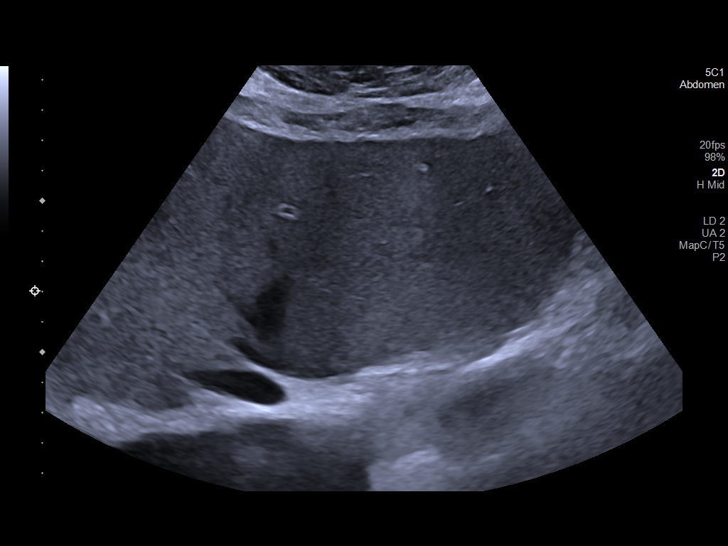
[im 9/30]
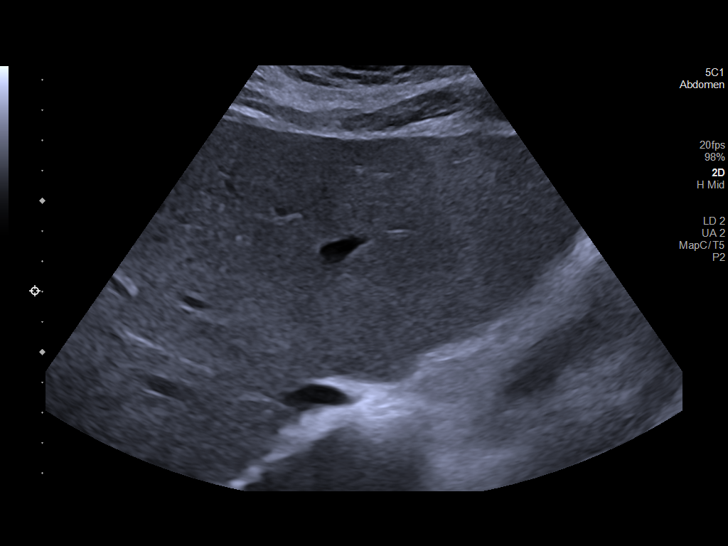
[im 11/30]
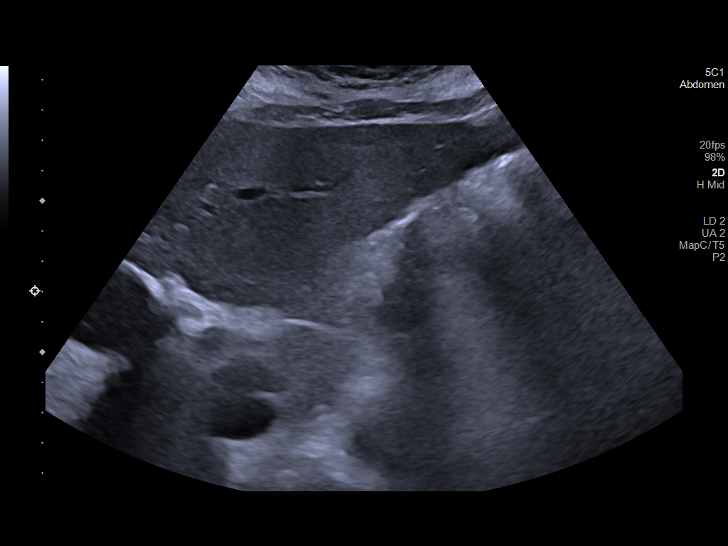
[im 14/30]
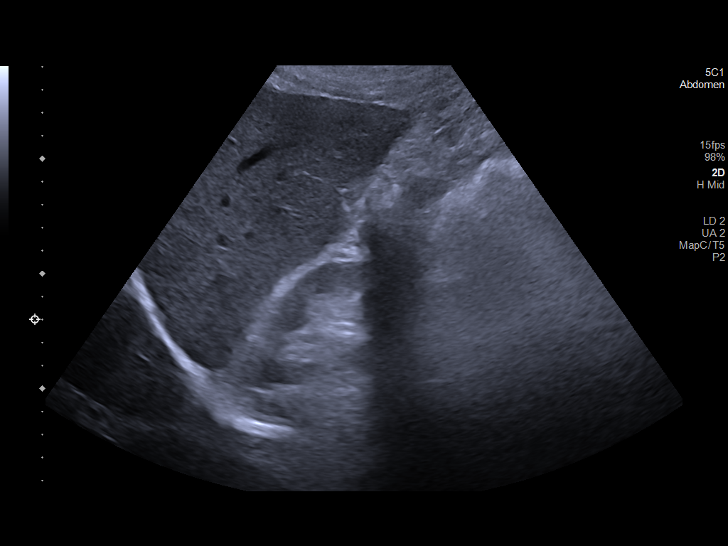
[im 16/30]
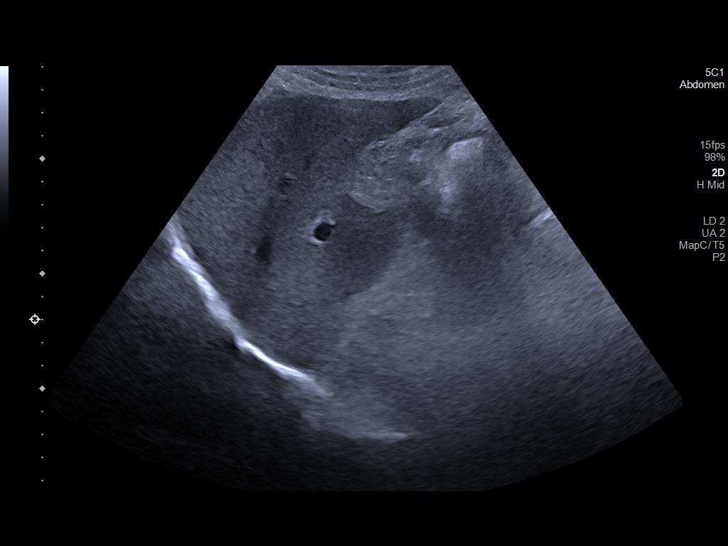
[im 19/30]
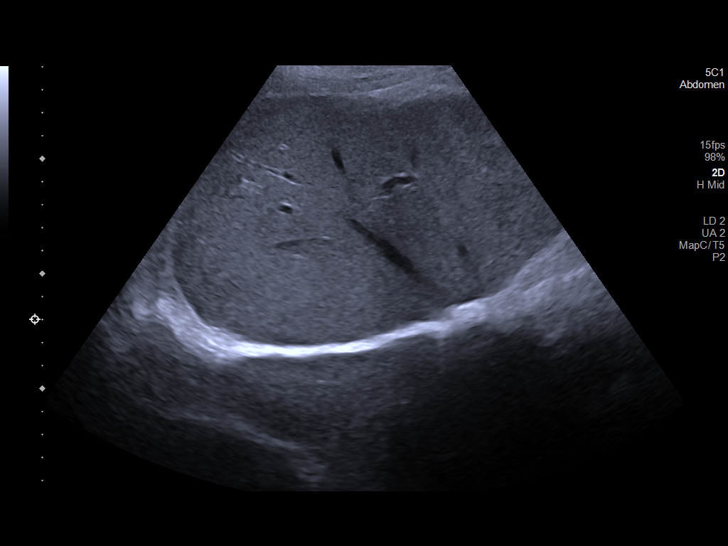
[im 21/30]
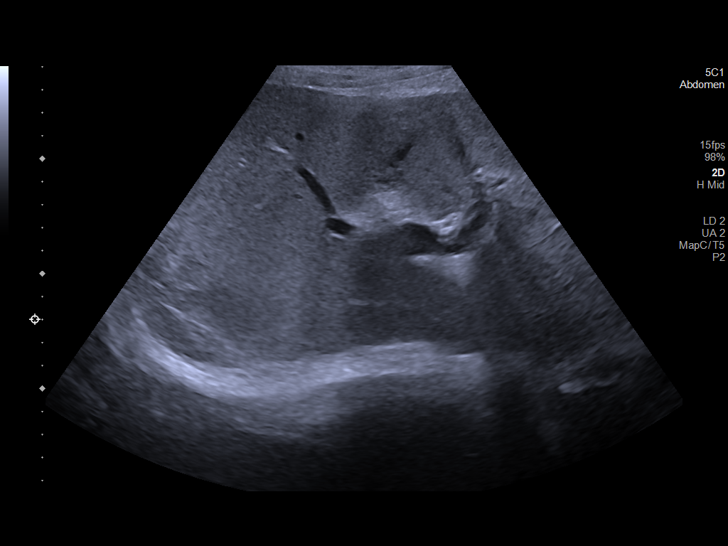
[im 23/30]
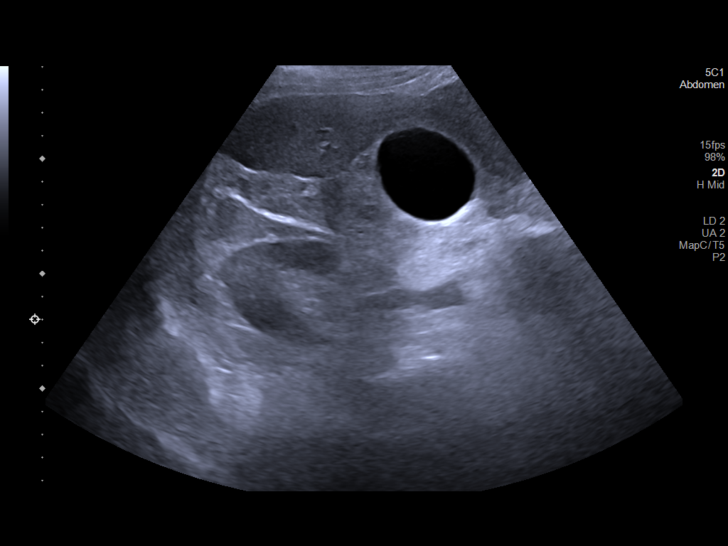
[im 26/30]
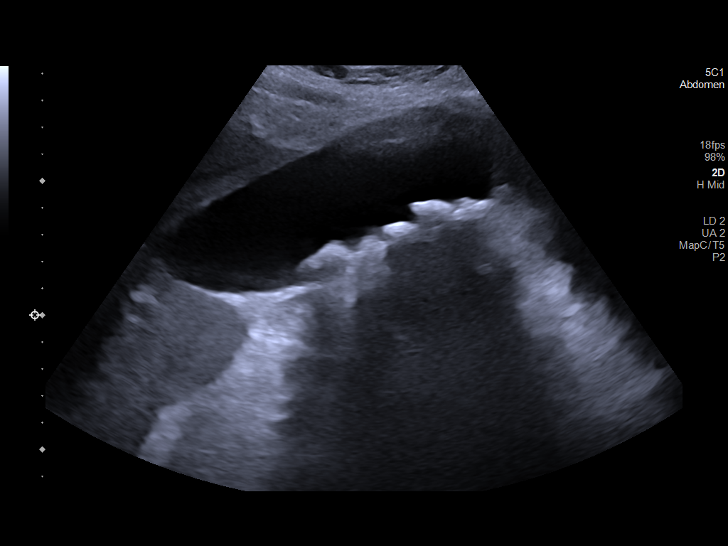
[im 28/30]
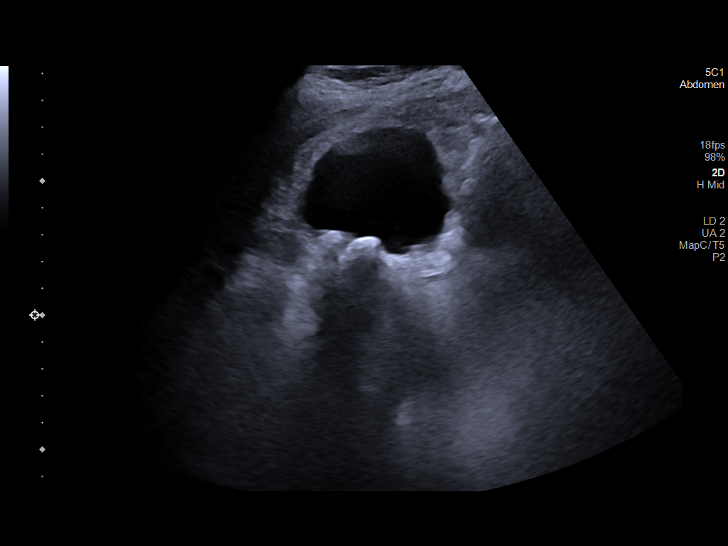

[12 of 25 positions shown; findings below may reference images not displayed]

FINDINGS: Liver: There is slightly increased echogenicity in the liver. There
is mild nodularity in the liver surface. No focal abnormality is
seen. Portal vein is patent on color Doppler imaging with normal
direction of blood flow towards the liver.

Multiple gallbladder stones are seen. There is no wall thickening in
gallbladder.

ULTRASOUND HEPATIC ELASTOGRAPHY

Device: Siemens Helix VTQ

Patient position: Supine

Transducer: MOYO

Number of measurements: 15

Hepatic segment:  8

Median kPa:

IQR:

IQR/Median kPa ratio:

Data quality:

Diagnostic category:

The use of hepatic elastography is applicable to patients with viral
hepatitis and non-alcoholic fatty liver disease. At this time, there
is insufficient data for the referenced cut-off values and use in
other causes of liver disease, including alcoholic liver disease.
Patients, however, may be assessed by elastography and serve as
their own reference standard/baseline.

In patients with non-alcoholic liver disease, the values suggesting
compensated advanced chronic liver disease (cACLD) may be lower, and
patients may need additional testing with elasticity results of [DATE]
kPa.

Please note that abnormal hepatic elasticity and shear wave
velocities may also be identified in clinical settings other than
with hepatic fibrosis, such as: acute hepatitis, elevated right
heart and central venous pressures including use of beta blockers,
Far disease ([place]), infiltrative processes such as
mastocytosis/amyloidosis/infiltrative tumor/lymphoma, extrahepatic
cholestasis, with hyperemia in the post-prandial state, and with
liver transplantation. Correlation with patient history, laboratory
data, and clinical condition recommended.

Diagnostic Categories:

< or =5 kPa: high probability of being normal

< or =9 kPa: in the absence of other known clinical signs, rules [DATE] kPa and ?13 kPa: suggestive of cACLD, but needs further testing

>13 kPa: highly suggestive of cACLD

> or =17 kPa: highly suggestive of cACLD with an increased
probability of clinically significant portal hypertension
IMPRESSION: ULTRASOUND LIVER:

Increased echogenicity suggesting fatty infiltration. No focal
abnormality is seen. Incidental note is made of gallbladder stones.

ULTRASOUND HEPATIC ELASTOGRAPHY:

Median kPa:

Diagnostic category:  High probability of being normal.

## 2025-01-27 ENCOUNTER — Ambulatory Visit: Admitting: Family Medicine
